# Patient Record
Sex: Female | Born: 1970 | Race: White | Hispanic: Yes | Marital: Married | Smoking: Never smoker
Health system: Southern US, Community
[De-identification: ages and names within clinical notes are randomized; demographics above are authoritative.]

---

## 2007-03-12 ENCOUNTER — Emergency Department (HOSPITAL_COMMUNITY): Admission: EM | Admit: 2007-03-12 | Discharge: 2007-03-12 | Payer: Self-pay | Admitting: Emergency Medicine

## 2011-08-02 ENCOUNTER — Ambulatory Visit (HOSPITAL_COMMUNITY)
Admission: RE | Admit: 2011-08-02 | Discharge: 2011-08-02 | Disposition: A | Discharge status: Home / Self Care | Payer: Self-pay | Source: Ambulatory Visit | Attending: Family Medicine | Admitting: Family Medicine

## 2011-08-02 ENCOUNTER — Other Ambulatory Visit (HOSPITAL_COMMUNITY): Payer: Self-pay | Admitting: Family Medicine

## 2011-08-02 DIAGNOSIS — M79609 Pain in unspecified limb: Secondary | ICD-10-CM | POA: Insufficient documentation

## 2011-08-02 DIAGNOSIS — M2010 Hallux valgus (acquired), unspecified foot: Secondary | ICD-10-CM

## 2013-01-11 IMAGING — CR DG FOOT COMPLETE 3+V*L*
3 series · 3 of 3 positions shown · non-contrast
Comparison: None.

CLINICAL DATA: Bilateral foot pain.  Hallux valgus deformity.

LEFT FOOT - COMPLETE 3+ VIEW

[t foot ap left]
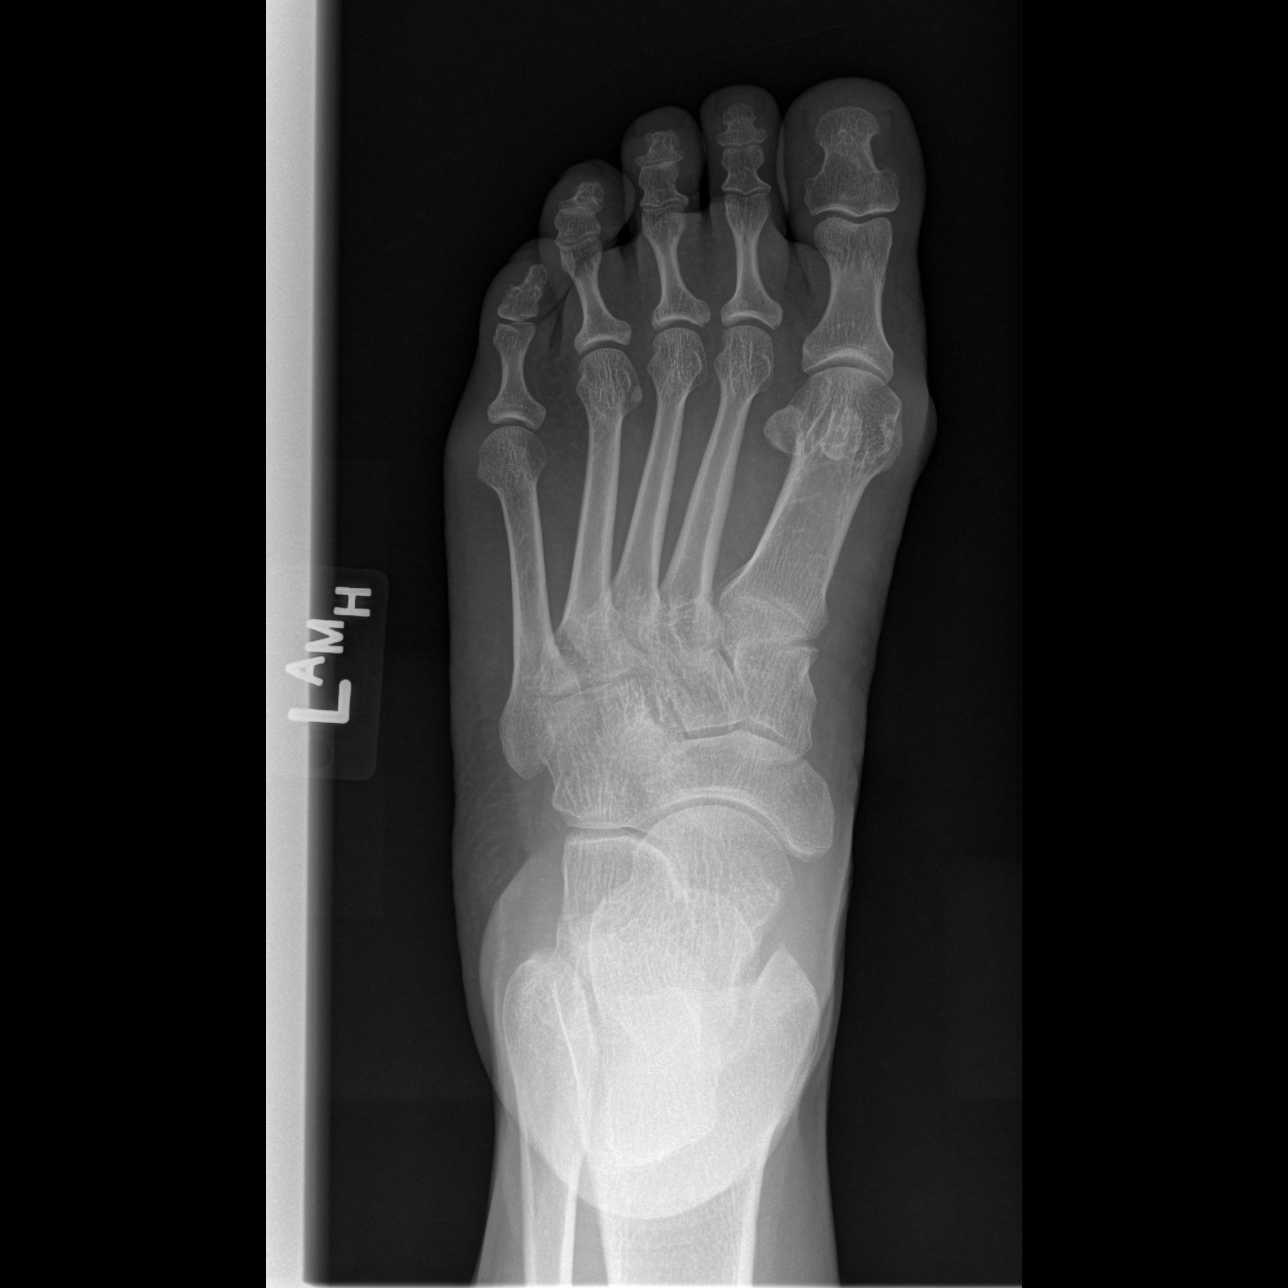

[t foot oblique left]
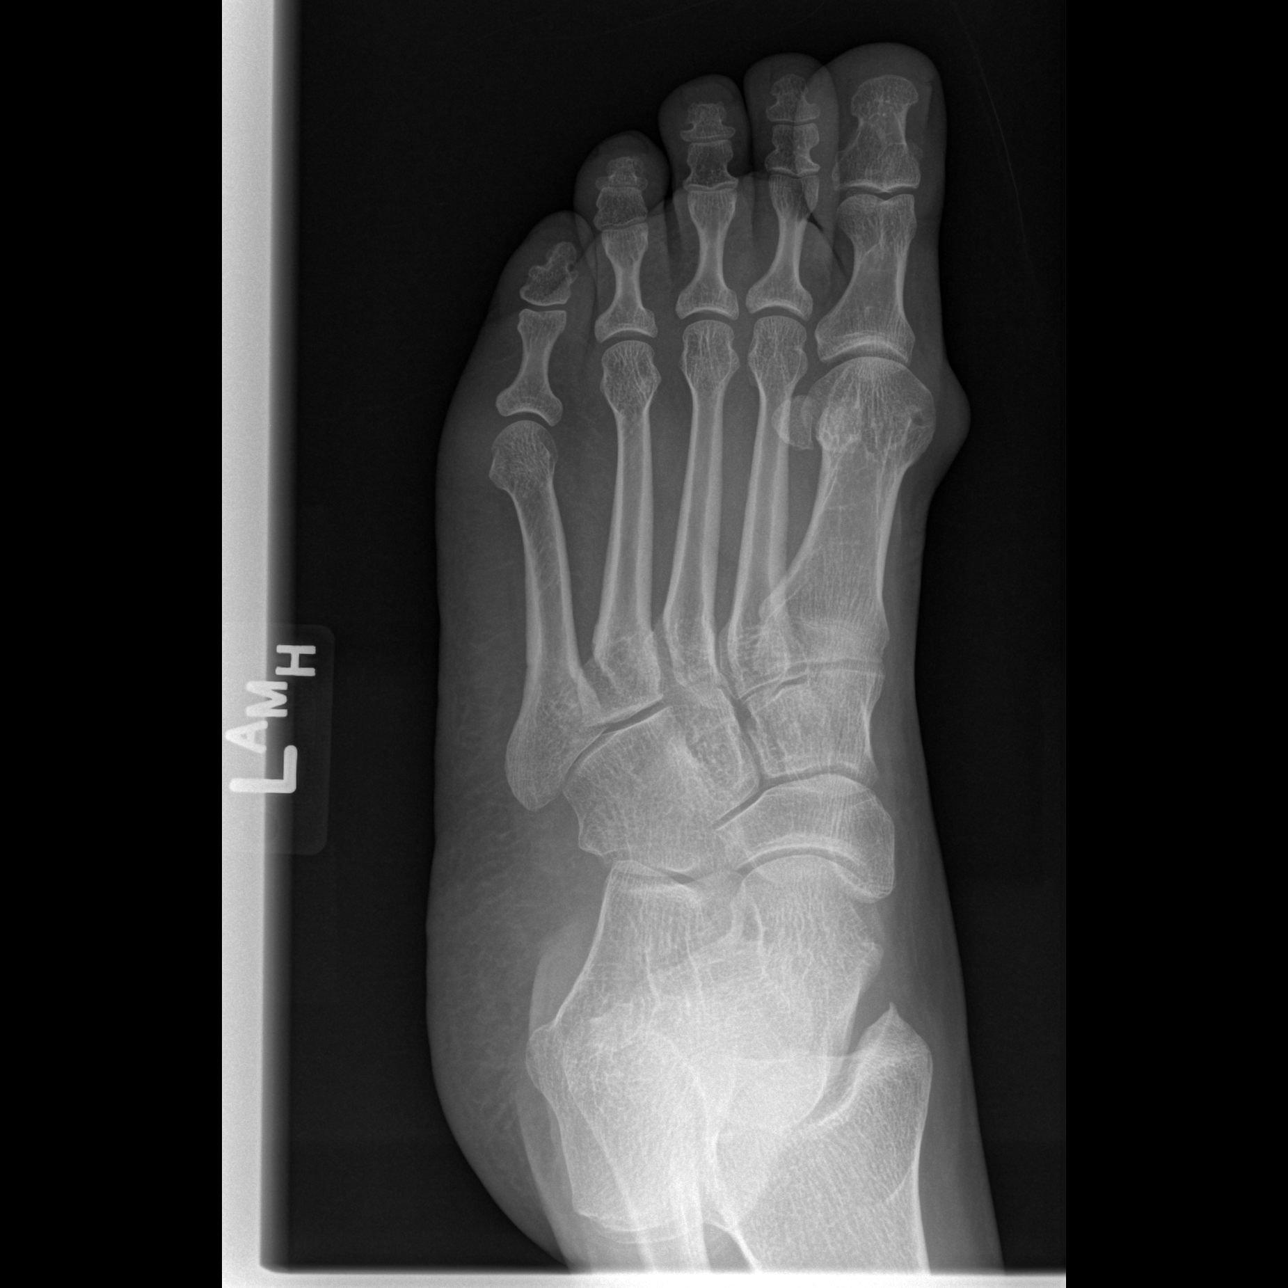

[t foot lat left]
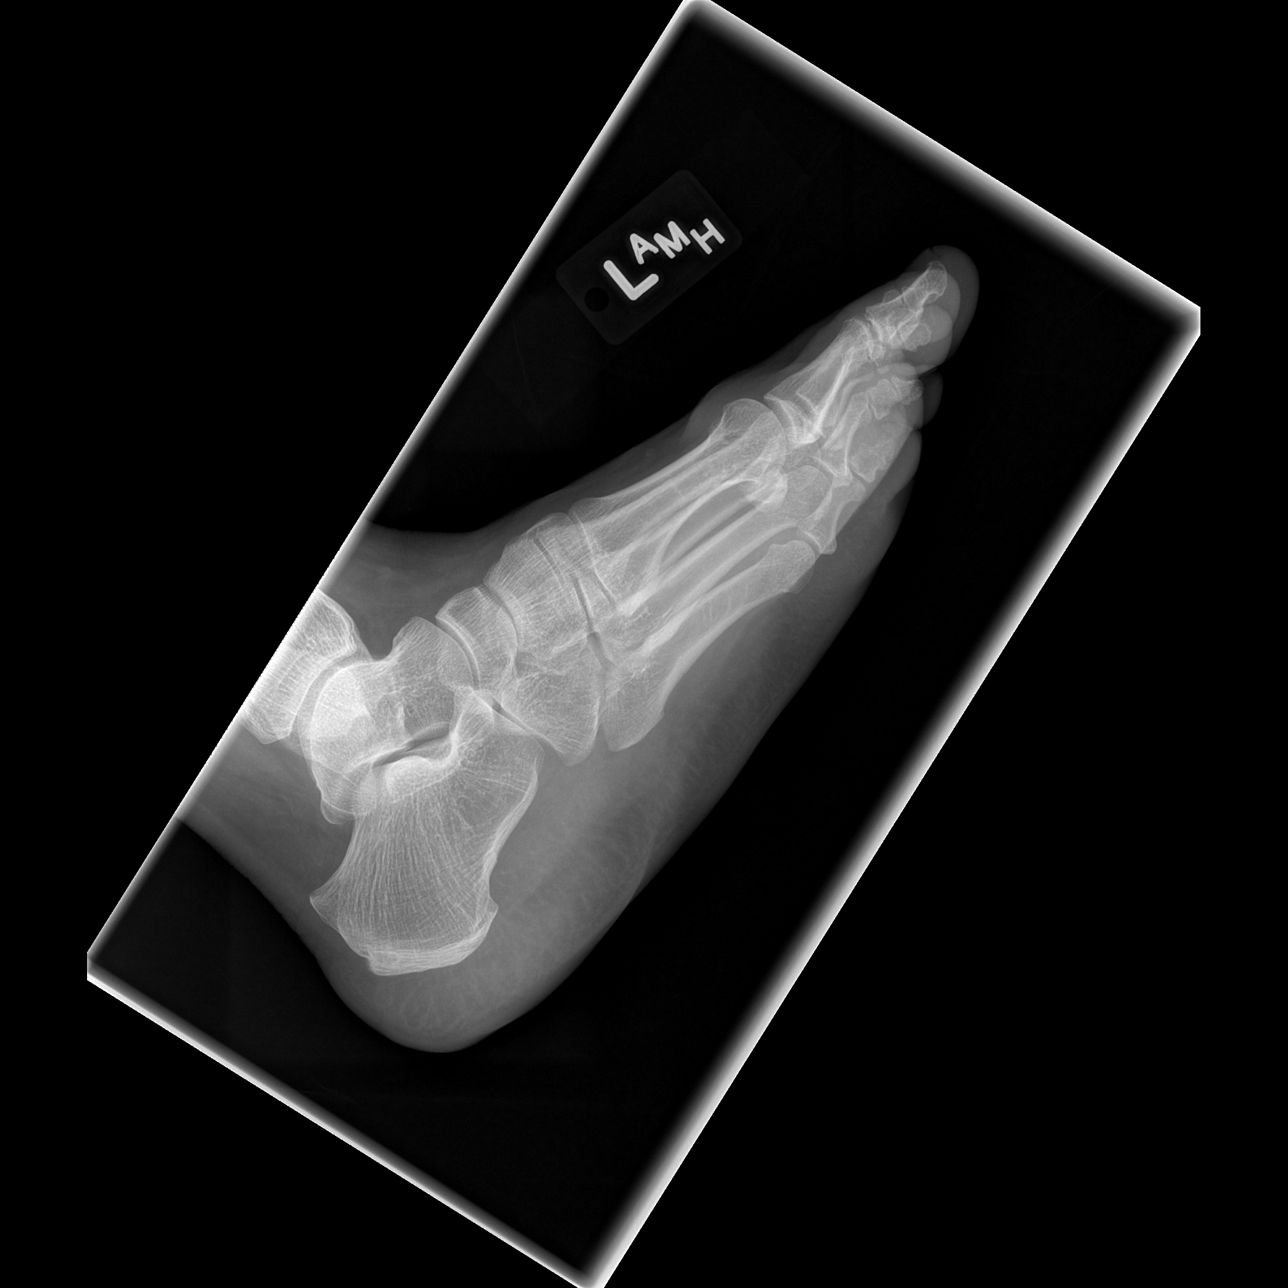

[3 of 3 positions shown; findings below may reference images not displayed]

FINDINGS: Mild hallux valgus deformity with overlying bunion.
Underlying joint space at the first MTP is maintained. No acute
fracture or dislocation.
IMPRESSION: Mild hallux valgus deformity with overlying bunion.  No acute
finding.

## 2013-01-19 ENCOUNTER — Emergency Department (HOSPITAL_COMMUNITY)
Admission: EM | Admit: 2013-01-19 | Discharge: 2013-01-19 | Disposition: A | Discharge status: Home / Self Care | Payer: Self-pay | Attending: Emergency Medicine | Admitting: Emergency Medicine

## 2013-01-19 ENCOUNTER — Encounter (HOSPITAL_COMMUNITY): Payer: Self-pay | Admitting: Physical Medicine and Rehabilitation

## 2013-01-19 DIAGNOSIS — L299 Pruritus, unspecified: Secondary | ICD-10-CM | POA: Insufficient documentation

## 2013-01-19 DIAGNOSIS — R21 Rash and other nonspecific skin eruption: Secondary | ICD-10-CM | POA: Insufficient documentation

## 2013-01-19 MED ORDER — DIPHENHYDRAMINE HCL 25 MG PO TABS: 25.0000 mg | ORAL_TABLET | Freq: Four times a day (QID) | ORAL | Status: DC

## 2013-01-19 MED ORDER — PREDNISONE 10 MG PO TABS: 20.0000 mg | ORAL_TABLET | Freq: Every day | ORAL | Status: DC

## 2013-01-19 MED ORDER — PERMETHRIN 5 % EX CREA: TOPICAL_CREAM | CUTANEOUS | Status: AC

## 2013-01-19 NOTE — ED Provider Notes (Signed)
History    This chart was scribed for non-physician practitioner working with [name]. [name], MD by [name], ED Scribe. This patient was seen in room TR06C/TR06C and the patient's care was started at 1751.   CSN: [id number]  Arrival date & time 01/19/13  1723   First MD Initiated Contact with Patient 01/19/13 1751      Chief Complaint  Patient presents with  . Pruritis  . Rash    (Consider location/radiation/quality/duration/timing/severity/associated sxs/prior treatment) The history is provided by the patient. A language interpreter was used ([place]).    Crystal Quinn is a 42 y.o. female who presents to the Emergency Department complaining of a gradually worsening, constant, itchy, axillary rash that has radiated to her inner thighs, and bilateral trunk that is aggravated by warm water and began 2 months ago. She denies any SOB, fever, chills, nausea, emesis, or diarrhea.  She denies any changes to her personal hygiene products. She denies any contacts at work with a similar rash. She reports that she treated the symptoms with Benadryl, but has not taken a dose in 3 days because she was not experiencing any relief. 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 She has no chronic medical conditions that she require daily medications.  No one else with similar rash.  No past medical history on file.  No past surgical history on file.  No family history on file.  History  Substance Use Topics  . Smoking status: Never Smoker   . Smokeless tobacco: Not on file  . Alcohol Use: No    OB History   Grav Para Term Preterm Abortions TAB SAB Ect Mult Living                  Review of Systems  Constitutional: Negative for fever and chills.  Gastrointestinal: Negative for nausea, vomiting and diarrhea.  Skin: Positive for rash.  All other systems reviewed and are  negative.    Allergies  Review of patient's allergies indicates no known allergies.  Home Medications  No current outpatient prescriptions on file.  BP 133/76  Pulse 66  Temp(Src) 98.7 F (37.1 C) (Oral)  Resp 18  SpO2 100%  Physical Exam  Nursing note and vitals reviewed. Constitutional: She is oriented to person, place, and time. She appears well-developed and well-nourished. No distress.  HENT:  Head: Normocephalic and atraumatic.  Eyes: EOM are normal. Pupils are equal, round, and reactive to light.  Neck: Normal range of motion. Neck supple. No tracheal deviation present.  Cardiovascular: Normal rate.   Pulmonary/Chest: Effort normal and breath sounds normal. No respiratory distress. She has no wheezes. She has no rales. She exhibits no tenderness.  Abdominal: Soft. She exhibits no distension.  Musculoskeletal: Normal range of motion. She exhibits no edema.  Neurological: She is alert and oriented to person, place, and time.  Skin: Skin is warm and dry. Rash noted. No petechiae noted. Rash is macular and papular. Rash is not pustular and not vesicular.  Macular papular rash noted to the bilateral axillary fold without evidence of abscess or signs of infection.   Macular papular rash also noted to the left lateral and posterior back and to both medial lateral thighs and to the inguinal region.  No oral mucosal involvement. No petechiae, vesicular,or pustular rash. No rash noted to the soles of the feet, palms of the hand, or on  the interweb space of the hands.    Psychiatric: She has a normal mood and affect. Her behavior is normal.    ED Course  Procedures (including critical care time)  DIAGNOSTIC STUDIES: Oxygen Saturation is 100% on room air, normal by my interpretation.    COORDINATION OF CARE:  18:20- Discussed planned course of treatment with the patient, including Elimite, Deltasone, and Benadryl, who is agreeable at this time.   Labs Reviewed - No data  to display No results found.   1. Rash and nonspecific skin eruption     BP 133/76  Pulse 66  Temp(Src) 98.7 F (37.1 C) (Oral)  Resp 18  SpO2 100%   MDM  Low suspion for infection. No red flag findings for life threatenign rash. I suspect this may be a local irritaion from a local irritant of unknown source; however since pt works with chemicals and works at [employer] I will treat her with pemethrin for possible scabies., along with prednisone and benadryl.    I personally performed the services described in this documentation, which was scribed in my presence. The recorded information has been reviewed and is accurate.         [name], PA-C 01/19/13 [name]

## 2013-01-19 NOTE — ED Notes (Signed)
Pt presents to department for evaluation of rash all over body, specifically axillary regions and under both legs. 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 No relief with creams and benadryl at home. Respirations unlabored. Pt is alert and oriented x4. No signs of distress noted.

## 2013-01-20 NOTE — ED Provider Notes (Signed)
Medical screening examination/treatment/procedure(s) were performed by non-physician practitioner and as supervising physician I was immediately available for consultation/collaboration.  [name]. [name], MD 01/20/13 0010

## 2018-04-30 ENCOUNTER — Ambulatory Visit: Payer: Self-pay

## 2018-05-10 ENCOUNTER — Other Ambulatory Visit: Payer: Self-pay

## 2018-05-10 ENCOUNTER — Ambulatory Visit (INDEPENDENT_AMBULATORY_CARE_PROVIDER_SITE_OTHER): Payer: Self-pay | Admitting: Physician Assistant

## 2018-05-10 NOTE — Progress Notes (Signed)
Patient seems to have two medical records.
